# Patient Record
Sex: Female | Born: 1980 | Race: Black or African American | Hispanic: No | Marital: Single | Smoking: Current every day smoker
Health system: Southern US, Community
[De-identification: ages and names within clinical notes are randomized; demographics above are authoritative.]

## PROBLEM LIST (undated history)

## (undated) DIAGNOSIS — Z124 Encounter for screening for malignant neoplasm of cervix: Secondary | ICD-10-CM

## (undated) DIAGNOSIS — Z720 Tobacco use: Secondary | ICD-10-CM

## (undated) DIAGNOSIS — D649 Anemia, unspecified: Secondary | ICD-10-CM

## (undated) DIAGNOSIS — E669 Obesity, unspecified: Secondary | ICD-10-CM

## (undated) DIAGNOSIS — M67439 Ganglion, unspecified wrist: Secondary | ICD-10-CM

## (undated) DIAGNOSIS — J209 Acute bronchitis, unspecified: Secondary | ICD-10-CM

## (undated) DIAGNOSIS — F32A Depression, unspecified: Secondary | ICD-10-CM

## (undated) DIAGNOSIS — F418 Other specified anxiety disorders: Secondary | ICD-10-CM

## (undated) DIAGNOSIS — F329 Major depressive disorder, single episode, unspecified: Secondary | ICD-10-CM

## (undated) DIAGNOSIS — R569 Unspecified convulsions: Secondary | ICD-10-CM

## (undated) DIAGNOSIS — Z0000 Encounter for general adult medical examination without abnormal findings: Secondary | ICD-10-CM

## (undated) DIAGNOSIS — B019 Varicella without complication: Secondary | ICD-10-CM

## (undated) HISTORY — DX: Anemia, unspecified: D64.9

## (undated) HISTORY — DX: Unspecified convulsions: R56.9

## (undated) HISTORY — DX: Major depressive disorder, single episode, unspecified: F32.9

## (undated) HISTORY — DX: Other specified anxiety disorders: F41.8

## (undated) HISTORY — DX: Tobacco use: Z72.0

## (undated) HISTORY — DX: Encounter for general adult medical examination without abnormal findings: Z00.00

## (undated) HISTORY — DX: Ganglion, unspecified wrist: M67.439

## (undated) HISTORY — DX: Encounter for screening for malignant neoplasm of cervix: Z12.4

## (undated) HISTORY — DX: Depression, unspecified: F32.A

## (undated) HISTORY — DX: Obesity, unspecified: E66.9

## (undated) HISTORY — DX: Varicella without complication: B01.9

## (undated) HISTORY — DX: Acute bronchitis, unspecified: J20.9

---

## 2012-07-06 ENCOUNTER — Ambulatory Visit: Payer: Self-pay | Admitting: Family Medicine

## 2012-08-04 ENCOUNTER — Ambulatory Visit (INDEPENDENT_AMBULATORY_CARE_PROVIDER_SITE_OTHER): Payer: BC Managed Care – PPO | Admitting: Family Medicine

## 2012-08-04 ENCOUNTER — Encounter: Payer: Self-pay | Admitting: Family Medicine

## 2012-08-04 VITALS — BP 108/82 | HR 82 | Temp 98.5°F | Ht 67.5 in | Wt 248.0 lb

## 2012-08-04 DIAGNOSIS — R569 Unspecified convulsions: Secondary | ICD-10-CM | POA: Insufficient documentation

## 2012-08-04 DIAGNOSIS — F17200 Nicotine dependence, unspecified, uncomplicated: Secondary | ICD-10-CM

## 2012-08-04 DIAGNOSIS — M67439 Ganglion, unspecified wrist: Secondary | ICD-10-CM | POA: Insufficient documentation

## 2012-08-04 DIAGNOSIS — F329 Major depressive disorder, single episode, unspecified: Secondary | ICD-10-CM

## 2012-08-04 DIAGNOSIS — M6740 Ganglion, unspecified site: Secondary | ICD-10-CM

## 2012-08-04 DIAGNOSIS — Z0000 Encounter for general adult medical examination without abnormal findings: Secondary | ICD-10-CM

## 2012-08-04 DIAGNOSIS — M67432 Ganglion, left wrist: Secondary | ICD-10-CM

## 2012-08-04 DIAGNOSIS — F418 Other specified anxiety disorders: Secondary | ICD-10-CM

## 2012-08-04 DIAGNOSIS — E669 Obesity, unspecified: Secondary | ICD-10-CM | POA: Insufficient documentation

## 2012-08-04 DIAGNOSIS — Z720 Tobacco use: Secondary | ICD-10-CM

## 2012-08-04 DIAGNOSIS — Z7251 High risk heterosexual behavior: Secondary | ICD-10-CM

## 2012-08-04 DIAGNOSIS — F341 Dysthymic disorder: Secondary | ICD-10-CM

## 2012-08-04 HISTORY — DX: Ganglion, unspecified wrist: M67.439

## 2012-08-04 HISTORY — DX: Tobacco use: Z72.0

## 2012-08-04 HISTORY — DX: Encounter for general adult medical examination without abnormal findings: Z00.00

## 2012-08-04 HISTORY — DX: Other specified anxiety disorders: F41.8

## 2012-08-04 LAB — LIPID PANEL
Cholesterol: 178 mg/dL (ref 0–200)
LDL Cholesterol: 116 mg/dL — ABNORMAL HIGH (ref 0–99)
Total CHOL/HDL Ratio: 4.3 Ratio
VLDL: 21 mg/dL (ref 0–40)

## 2012-08-04 LAB — HEPATIC FUNCTION PANEL
Bilirubin, Direct: 0.1 mg/dL (ref 0.0–0.3)
Indirect Bilirubin: 0.2 mg/dL (ref 0.0–0.9)
Total Bilirubin: 0.3 mg/dL (ref 0.3–1.2)

## 2012-08-04 LAB — CBC
HCT: 40.4 % (ref 36.0–46.0)
Hemoglobin: 13.7 g/dL (ref 12.0–15.0)
MCH: 32.2 pg (ref 26.0–34.0)
MCHC: 33.9 g/dL (ref 30.0–36.0)
MCV: 94.8 fL (ref 78.0–100.0)
RDW: 14.1 % (ref 11.5–15.5)

## 2012-08-04 LAB — RENAL FUNCTION PANEL
BUN: 15 mg/dL (ref 6–23)
Chloride: 104 mEq/L (ref 96–112)
Creat: 0.88 mg/dL (ref 0.50–1.10)
Glucose, Bld: 83 mg/dL (ref 70–99)
Phosphorus: 3 mg/dL (ref 2.3–4.6)

## 2012-08-04 LAB — RPR

## 2012-08-04 LAB — HIV ANTIBODY (ROUTINE TESTING W REFLEX): HIV: NONREACTIVE

## 2012-08-04 MED ORDER — ALPRAZOLAM 0.25 MG PO TABS: 0.2500 mg | ORAL_TABLET | Freq: Two times a day (BID) | ORAL | Status: DC | PRN

## 2012-08-04 MED ORDER — ESCITALOPRAM OXALATE 10 MG PO TABS: 10.0000 mg | ORAL_TABLET | Freq: Every day | ORAL | Status: DC

## 2012-08-04 NOTE — Assessment & Plan Note (Signed)
Started on Ecitalopram daily and may use Alprazolam sparingly, reassess at next visit

## 2012-08-04 NOTE — Assessment & Plan Note (Signed)
Encouraged DASH diet and increased exercise, check labs.

## 2012-08-04 NOTE — Patient Instructions (Addendum)
Next visit gyn   Preventive Care for Adults, Female A healthy lifestyle and preventive care can promote health and wellness. Preventive health guidelines for women include the following key practices.  A routine yearly physical is a good way to check with your caregiver about your health and preventive screening. It is a chance to share any concerns and updates on your health, and to receive a thorough exam.  Visit your dentist for a routine exam and preventive care every 6 months. Brush your teeth twice a day and floss once a day. Good oral hygiene prevents tooth decay and gum disease.  The frequency of eye exams is based on your age, health, family medical history, use of contact lenses, and other factors. Follow your caregiver's recommendations for frequency of eye exams.  Eat a healthy diet. Foods like vegetables, fruits, whole grains, low-fat dairy products, and lean protein foods contain the nutrients you need without too many calories. Decrease your intake of foods high in solid fats, added sugars, and salt. Eat the right amount of calories for you.Get information about a proper diet from your caregiver, if necessary.  Regular physical exercise is one of the most important things you can do for your health. Most adults should get at least 150 minutes of moderate-intensity exercise (any activity that increases your heart rate and causes you to sweat) each week. In addition, most adults need muscle-strengthening exercises on 2 or more days a week.  Maintain a healthy weight. The body mass index (BMI) is a screening tool to identify possible weight problems. It provides an estimate of body fat based on height and weight. Your caregiver can help determine your BMI, and can help you achieve or maintain a healthy weight.For adults 20 years and older:  A BMI below 18.5 is considered underweight.  A BMI of 18.5 to 24.9 is normal.  A BMI of 25 to 29.9 is considered overweight.  A BMI of 30  and above is considered obese.  Maintain normal blood lipids and cholesterol levels by exercising and minimizing your intake of saturated fat. Eat a balanced diet with plenty of fruit and vegetables. Blood tests for lipids and cholesterol should begin at age 33 and be repeated every 5 years. If your lipid or cholesterol levels are high, you are over 50, or you are at high risk for heart disease, you may need your cholesterol levels checked more frequently.Ongoing high lipid and cholesterol levels should be treated with medicines if diet and exercise are not effective.  If you smoke, find out from your caregiver how to quit. If you do not use tobacco, do not start.  If you are pregnant, do not drink alcohol. If you are breastfeeding, be very cautious about drinking alcohol. If you are not pregnant and choose to drink alcohol, do not exceed 1 drink per day. One drink is considered to be 12 ounces (355 mL) of beer, 5 ounces (148 mL) of wine, or 1.5 ounces (44 mL) of liquor.  Avoid use of street drugs. Do not share needles with anyone. Ask for help if you need support or instructions about stopping the use of drugs.  High blood pressure causes heart disease and increases the risk of stroke. Your blood pressure should be checked at least every 1 to 2 years. Ongoing high blood pressure should be treated with medicines if weight loss and exercise are not effective.  If you are 33 to 32 years old, ask your caregiver if you should take aspirin  to prevent strokes.  Diabetes screening involves taking a blood sample to check your fasting blood sugar level. This should be done once every 3 years, after age 14, if you are within normal weight and without risk factors for diabetes. Testing should be considered at a younger age or be carried out more frequently if you are overweight and have at least 1 risk factor for diabetes.  Breast cancer screening is essential preventive care for women. You should practice  "breast self-awareness." This means understanding the normal appearance and feel of your breasts and may include breast self-examination. Any changes detected, no matter how small, should be reported to a caregiver. Women in their 84s and 30s should have a clinical breast exam (CBE) by a caregiver as part of a regular health exam every 1 to 3 years. After age 19, women should have a CBE every year. Starting at age 20, women should consider having a mammography (breast X-ray test) every year. Women who have a family history of breast cancer should talk to their caregiver about genetic screening. Women at a high risk of breast cancer should talk to their caregivers about having magnetic resonance imaging (MRI) and a mammography every year.  The Pap test is a screening test for cervical cancer. A Pap test can show cell changes on the cervix that might become cervical cancer if left untreated. A Pap test is a procedure in which cells are obtained and examined from the lower end of the uterus (cervix).  Women should have a Pap test starting at age 21.  Between ages 35 and 48, Pap tests should be repeated every 2 years.  Beginning at age 26, you should have a Pap test every 3 years as long as the past 3 Pap tests have been normal.  Some women have medical problems that increase the chance of getting cervical cancer. Talk to your caregiver about these problems. It is especially important to talk to your caregiver if a new problem develops soon after your last Pap test. In these cases, your caregiver may recommend more frequent screening and Pap tests.  The above recommendations are the same for women who have or have not gotten the vaccine for human papillomavirus (HPV).  If you had a hysterectomy for a problem that was not cancer or a condition that could lead to cancer, then you no longer need Pap tests. Even if you no longer need a Pap test, a regular exam is a good idea to make sure no other problems are  starting.  If you are between ages 63 and 64, and you have had normal Pap tests going back 10 years, you no longer need Pap tests. Even if you no longer need a Pap test, a regular exam is a good idea to make sure no other problems are starting.  If you have had past treatment for cervical cancer or a condition that could lead to cancer, you need Pap tests and screening for cancer for at least 20 years after your treatment.  If Pap tests have been discontinued, risk factors (such as a new sexual partner) need to be reassessed to determine if screening should be resumed.  The HPV test is an additional test that may be used for cervical cancer screening. The HPV test looks for the virus that can cause the cell changes on the cervix. The cells collected during the Pap test can be tested for HPV. The HPV test could be used to screen women aged 20  years and older, and should be used in women of any age who have unclear Pap test results. After the age of 59, women should have HPV testing at the same frequency as a Pap test.  Colorectal cancer can be detected and often prevented. Most routine colorectal cancer screening begins at the age of 23 and continues through age 88. However, your caregiver may recommend screening at an earlier age if you have risk factors for colon cancer. On a yearly basis, your caregiver may provide home test kits to check for hidden blood in the stool. Use of a small camera at the end of a tube, to directly examine the colon (sigmoidoscopy or colonoscopy), can detect the earliest forms of colorectal cancer. Talk to your caregiver about this at age 49, when routine screening begins. Direct examination of the colon should be repeated every 5 to 10 years through age 39, unless early forms of pre-cancerous polyps or small growths are found.  Hepatitis C blood testing is recommended for all people born from [birth year] through 1965 and any individual with known risks for hepatitis C.  Practice  safe sex. Use condoms and avoid high-risk sexual practices to reduce the spread of sexually transmitted infections (STIs). STIs include gonorrhea, chlamydia, syphilis, trichomonas, herpes, HPV, and human immunodeficiency virus (HIV). Herpes, HIV, and HPV are viral illnesses that have no cure. They can result in disability, cancer, and death. Sexually active women aged 39 and younger should be checked for chlamydia. Older women with new or multiple partners should also be tested for chlamydia. Testing for other STIs is recommended if you are sexually active and at increased risk.  Osteoporosis is a disease in which the bones lose minerals and strength with aging. This can result in serious bone fractures. The risk of osteoporosis can be identified using a bone density scan. Women ages 26 and over and women at risk for fractures or osteoporosis should discuss screening with their caregivers. Ask your caregiver whether you should take a calcium supplement or vitamin D to reduce the rate of osteoporosis.  Menopause can be associated with physical symptoms and risks. Hormone replacement therapy is available to decrease symptoms and risks. You should talk to your caregiver about whether hormone replacement therapy is right for you.  Use sunscreen with sun protection factor (SPF) of 30 or more. Apply sunscreen liberally and repeatedly throughout the day. You should seek shade when your shadow is shorter than you. Protect yourself by wearing long sleeves, pants, a wide-brimmed hat, and sunglasses year round, whenever you are outdoors.  Once a month, do a whole body skin exam, using a mirror to look at the skin on your back. Notify your caregiver of new moles, moles that have irregular borders, moles that are larger than a pencil eraser, or moles that have changed in shape or color.  Stay current with required immunizations.  Influenza. You need a dose every fall (or winter). The composition of the flu vaccine  changes each year, so being vaccinated once is not enough.  Pneumococcal polysaccharide. You need 1 to 2 doses if you smoke cigarettes or if you have certain chronic medical conditions. You need 1 dose at age 67 (or older) if you have never been vaccinated.  Tetanus, diphtheria, pertussis (Tdap, Td). Get 1 dose of Tdap vaccine if you are younger than age 52, are over 86 and have contact with an infant, are a Research scientist (physical sciences), are pregnant, or simply want to be protected from whooping cough. After  that, you need a Td booster dose every 10 years. Consult your caregiver if you have not had at least 3 tetanus and diphtheria-containing shots sometime in your life or have a deep or dirty wound.  HPV. You need this vaccine if you are a woman age 77 or younger. The vaccine is given in 3 doses over 6 months.  Measles, mumps, rubella (MMR). You need at least 1 dose of MMR if you were born in 1957 or later. You may also need a second dose.  Meningococcal. If you are age 48 to 12 and a first-year college student living in a residence hall, or have one of several medical conditions, you need to get vaccinated against meningococcal disease. You may also need additional booster doses.  Zoster (shingles). If you are age 26 or older, you should get this vaccine.  Varicella (chickenpox). If you have never had chickenpox or you were vaccinated but received only 1 dose, talk to your caregiver to find out if you need this vaccine.  Hepatitis A. You need this vaccine if you have a specific risk factor for hepatitis A virus infection or you simply wish to be protected from this disease. The vaccine is usually given as 2 doses, 6 to 18 months apart.  Hepatitis B. You need this vaccine if you have a specific risk factor for hepatitis B virus infection or you simply wish to be protected from this disease. The vaccine is given in 3 doses, usually over 6 months. Preventive Services / Frequency Ages 32 to 102  Blood  pressure check.** / Every 1 to 2 years.  Lipid and cholesterol check.** / Every 5 years beginning at age 64.  Clinical breast exam.** / Every 3 years for women in their 22s and 30s.  Pap test.** / Every 2 years from ages 21 through 76. Every 3 years starting at age 8 through age 35 or 41 with a history of 3 consecutive normal Pap tests.  HPV screening.** / Every 3 years from ages 34 through ages 84 to 10 with a history of 3 consecutive normal Pap tests.  Hepatitis C blood test.** / For any individual with known risks for hepatitis C.  Skin self-exam. / Monthly.  Influenza immunization.** / Every year.  Pneumococcal polysaccharide immunization.** / 1 to 2 doses if you smoke cigarettes or if you have certain chronic medical conditions.  Tetanus, diphtheria, pertussis (Tdap, Td) immunization. / A one-time dose of Tdap vaccine. After that, you need a Td booster dose every 10 years.  HPV immunization. / 3 doses over 6 months, if you are 73 and younger.  Measles, mumps, rubella (MMR) immunization. / You need at least 1 dose of MMR if you were born in 1957 or later. You may also need a second dose.  Meningococcal immunization. / 1 dose if you are age 74 to 26 and a first-year college student living in a residence hall, or have one of several medical conditions, you need to get vaccinated against meningococcal disease. You may also need additional booster doses.  Varicella immunization.** / Consult your caregiver.  Hepatitis A immunization.** / Consult your caregiver. 2 doses, 6 to 18 months apart.  Hepatitis B immunization.** / Consult your caregiver. 3 doses usually over 6 months. Ages 79 to 10  Blood pressure check.** / Every 1 to 2 years.  Lipid and cholesterol check.** / Every 5 years beginning at age 49.  Clinical breast exam.** / Every year after age 94.  Mammogram.** / Every  year beginning at age 87 and continuing for as long as you are in good health. Consult with your  caregiver.  Pap test.** / Every 3 years starting at age 71 through age 81 or 52 with a history of 3 consecutive normal Pap tests.  HPV screening.** / Every 3 years from ages 25 through ages 29 to 53 with a history of 3 consecutive normal Pap tests.  Fecal occult blood test (FOBT) of stool. / Every year beginning at age 4 and continuing until age 74. You may not need to do this test if you get a colonoscopy every 10 years.  Flexible sigmoidoscopy or colonoscopy.** / Every 5 years for a flexible sigmoidoscopy or every 10 years for a colonoscopy beginning at age 2 and continuing until age 74.  Hepatitis C blood test.** / For all people born from [birth year] through 1965 and any individual with known risks for hepatitis C.  Skin self-exam. / Monthly.  Influenza immunization.** / Every year.  Pneumococcal polysaccharide immunization.** / 1 to 2 doses if you smoke cigarettes or if you have certain chronic medical conditions.  Tetanus, diphtheria, pertussis (Tdap, Td) immunization.** / A one-time dose of Tdap vaccine. After that, you need a Td booster dose every 10 years.  Measles, mumps, rubella (MMR) immunization. / You need at least 1 dose of MMR if you were born in 1957 or later. You may also need a second dose.  Varicella immunization.** / Consult your caregiver.  Meningococcal immunization.** / Consult your caregiver.  Hepatitis A immunization.** / Consult your caregiver. 2 doses, 6 to 18 months apart.  Hepatitis B immunization.** / Consult your caregiver. 3 doses, usually over 6 months. Ages 63 and over  Blood pressure check.** / Every 1 to 2 years.  Lipid and cholesterol check.** / Every 5 years beginning at age 41.  Clinical breast exam.** / Every year after age 61.  Mammogram.** / Every year beginning at age 34 and continuing for as long as you are in good health. Consult with your caregiver.  Pap test.** / Every 3 years starting at age 65 through age 19 or 105 with a 3  consecutive normal Pap tests. Testing can be stopped between 65 and 70 with 3 consecutive normal Pap tests and no abnormal Pap or HPV tests in the past 10 years.  HPV screening.** / Every 3 years from ages 37 through ages 46 or 72 with a history of 3 consecutive normal Pap tests. Testing can be stopped between 65 and 70 with 3 consecutive normal Pap tests and no abnormal Pap or HPV tests in the past 10 years.  Fecal occult blood test (FOBT) of stool. / Every year beginning at age 50 and continuing until age 83. You may not need to do this test if you get a colonoscopy every 10 years.  Flexible sigmoidoscopy or colonoscopy.** / Every 5 years for a flexible sigmoidoscopy or every 10 years for a colonoscopy beginning at age 57 and continuing until age 18.  Hepatitis C blood test.** / For all people born from [birth year] through 1965 and any individual with known risks for hepatitis C.  Osteoporosis screening.** / A one-time screening for women ages 105 and over and women at risk for fractures or osteoporosis.  Skin self-exam. / Monthly.  Influenza immunization.** / Every year.  Pneumococcal polysaccharide immunization.** / 1 dose at age 102 (or older) if you have never been vaccinated.  Tetanus, diphtheria, pertussis (Tdap, Td) immunization. / A one-time dose of Tdap  vaccine if you are over 65 and have contact with an infant, are a Research scientist (physical sciences), or simply want to be protected from whooping cough. After that, you need a Td booster dose every 10 years.  Varicella immunization.** / Consult your caregiver.  Meningococcal immunization.** / Consult your caregiver.  Hepatitis A immunization.** / Consult your caregiver. 2 doses, 6 to 18 months apart.  Hepatitis B immunization.** / Check with your caregiver. 3 doses, usually over 6 months. ** Family history and personal history of risk and conditions may change your caregiver's recommendations. Document Released: 04/01/2001 Document Revised: 04/28/2011  Document Reviewed: 07/01/2010 [facility] Patient Information 2014 [place], [place].

## 2012-08-04 NOTE — Assessment & Plan Note (Signed)
Will return for pap at next visit. Labs drawn today. enocouraged DASH diet,

## 2012-08-04 NOTE — Progress Notes (Signed)
Patient ID: Toni Weiss, female   DOB: [birth year], 32 y.o.   MRN: [id number] Wandalee Klang [id number] Feb 22, 1980 08/04/2012      Progress Note New Patient  Subjective  Chief Complaint  Chief Complaint  Patient presents with  . Establish Care    new patient    HPI  Patient is a 32 year old [place] American female who is in today for new patient appointment. She's not had health insurance for many years so has not seen a doctor. He is struggling with significant depression and anxiety. Sheet knowledge is low mood frequently and recently has been having increased anxiety and panic attacks. She's had several episodes of palpitations with shortness or breath a sense of doom tremulousness and outbursts. She is here today in hopes of starting medications to manage her condition. 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 She offers a complaint of a mildly tender lesion on her left wrist for the last few months. Beer seizures are noted into her fingers result. No chest pain at this time. No GI or GU concerns noted at today's visit.  Past Medical History  Diagnosis Date  . Chicken pox as a child  . Seizures     ended by age 28  . Depression   . Obesity   . Anemia     in pregnancy  . Depression with anxiety 08/04/2012  . Tobacco abuse 08/04/2012  . Ganglion cyst of wrist 08/04/2012    left  . Preventative health care 08/04/2012    History reviewed. No pertinent past surgical history.  Family History  Problem Relation Age of Onset  . Hypertension Mother   . Other Mother 24    pacemaker  . Depression Mother   . Thyroid disease Mother     hypo  . Heart disease Mother 34    bradycardia to low of 32, now with pacer, valvular heart dz  . Hyperlipidemia Father   . Hypertension Father   . Other Father     blood clots in lung  . Depression Father   . Depression Brother   . Other Brother     slow leaky valve  . Heart disease Brother    valvular heart disease  . Asthma Son   . Alcohol abuse Maternal Grandmother   . Heart disease Maternal Grandmother   . Stroke Maternal Grandmother   . Thyroid disease Maternal Grandmother     hypo  . Cancer Maternal Grandfather     prostate  . Alzheimer's disease Paternal Grandmother   . Cancer Paternal Grandfather     prostate  . Heart attack Paternal Grandfather   . Heart disease Paternal Grandfather 30    MI  . Thyroid disease Maternal Aunt     hyper  . Heart disease Maternal Aunt     History   Social History  . Marital Status: Single    Spouse Name: N/A    Number of Children: N/A  . Years of Education: N/A   Occupational History  . Not on file.   Social History Main Topics  . Smoking status: Current Every Day Smoker -- 0.25 packs/day for 10 years    Types: Cigarettes  . Smokeless tobacco: Never Used  . Alcohol Use: Yes     Comment: occasionally  . Drug Use: No  . Sexually Active: Yes -- Female partner(s)   Other Topics Concern  . Not on file   Social History Narrative  . No narrative  on file    No current outpatient prescriptions on file prior to visit.   No current facility-administered medications on file prior to visit.    Allergies  Allergen Reactions  . Latex     Review of Systems  Review of Systems  Constitutional: Negative for fever, chills and malaise/fatigue.  HENT: Negative for hearing loss, nosebleeds and congestion.   Eyes: Negative for pain and discharge.  Respiratory: Negative for cough, sputum production, shortness of breath and wheezing.   Cardiovascular: Negative for chest pain, palpitations and leg swelling.  Gastrointestinal: Negative for heartburn, nausea, vomiting, abdominal pain, diarrhea, constipation and blood in stool.  Genitourinary: Negative for dysuria, urgency, frequency and hematuria.  Musculoskeletal: Positive for joint pain. Negative for myalgias, back pain and falls.  Skin: Negative for rash.  Neurological:  Negative for dizziness, tremors, sensory change, focal weakness, loss of consciousness, weakness and headaches.  Endo/Heme/Allergies: Negative for polydipsia. Does not bruise/bleed easily.  Psychiatric/Behavioral: Positive for depression. Negative for suicidal ideas. The patient is nervous/anxious. The patient does not have insomnia.     Objective  BP 108/82  Pulse 82  Temp(Src) 98.5 F (36.9 C) (Oral)  Ht 5' 7.5" (1.715 m)  Wt 248 lb 0.6 oz (112.51 kg)  BMI 38.25 kg/m2  SpO2 98%  LMP 07/21/2012  Physical Exam  Physical Exam  Constitutional: She is oriented to person, place, and time and well-developed, well-nourished, and in no distress. No distress.  HENT:  Head: Normocephalic and atraumatic.  Right Ear: External ear normal.  Left Ear: External ear normal.  Nose: Nose normal.  Mouth/Throat: Oropharynx is clear and moist. No oropharyngeal exudate.  Eyes: Conjunctivae are normal. Pupils are equal, round, and reactive to light. Right eye exhibits no discharge. Left eye exhibits no discharge. No scleral icterus.  Neck: Normal range of motion. Neck supple. No thyromegaly present.  Cardiovascular: Normal rate, regular rhythm, normal heart sounds and intact distal pulses.   No murmur heard. Pulmonary/Chest: Effort normal and breath sounds normal. No respiratory distress. She has no wheezes. She has no rales.  Abdominal: Soft. Bowel sounds are normal. She exhibits no distension and no mass. There is no tenderness.  Musculoskeletal: Normal range of motion. She exhibits no edema and no tenderness.  frm nontender, slightly mobile lesion on left posterior wrist  Lymphadenopathy:    She has no cervical adenopathy.  Neurological: She is alert and oriented to person, place, and time. She has normal reflexes. No cranial nerve deficit. Coordination normal.  Skin: Skin is warm and dry. No rash noted. She is not diaphoretic.  Psychiatric: Mood, memory and affect normal.       Assessment &  Plan  Depression with anxiety Started on Ecitalopram daily and may use Alprazolam sparingly, reassess at next visit  Tobacco abuse Encouraged complete cessation can consider nicotine replacement products.  Ganglion cyst of wrist Encouraged ice and Aspercreme, splinting prn and consider referral if continues to enlarge.  Obesity Encouraged DASH diet and increased exercise, check labs.  Preventative health care Will return for pap at next visit. Labs drawn today. enocouraged DASH diet,

## 2012-08-04 NOTE — Assessment & Plan Note (Signed)
Encouraged ice and Aspercreme, splinting prn and consider referral if continues to enlarge.

## 2012-08-04 NOTE — Assessment & Plan Note (Signed)
Encouraged complete cessation can consider nicotine replacement products.

## 2012-09-13 ENCOUNTER — Ambulatory Visit: Payer: BC Managed Care – PPO | Admitting: Family Medicine

## 2012-10-02 ENCOUNTER — Other Ambulatory Visit: Payer: Self-pay | Admitting: Family Medicine

## 2012-11-03 ENCOUNTER — Encounter: Payer: Self-pay | Admitting: Family Medicine

## 2012-11-03 ENCOUNTER — Ambulatory Visit (INDEPENDENT_AMBULATORY_CARE_PROVIDER_SITE_OTHER): Payer: BC Managed Care – PPO | Admitting: Family Medicine

## 2012-11-03 ENCOUNTER — Other Ambulatory Visit: Payer: Self-pay | Admitting: Family Medicine

## 2012-11-03 ENCOUNTER — Encounter: Payer: Self-pay | Admitting: *Deleted

## 2012-11-03 VITALS — BP 100/72 | HR 64 | Temp 98.4°F | Ht 67.5 in | Wt 254.1 lb

## 2012-11-03 DIAGNOSIS — Z0000 Encounter for general adult medical examination without abnormal findings: Secondary | ICD-10-CM

## 2012-11-03 DIAGNOSIS — G43909 Migraine, unspecified, not intractable, without status migrainosus: Secondary | ICD-10-CM

## 2012-11-03 DIAGNOSIS — N926 Irregular menstruation, unspecified: Secondary | ICD-10-CM

## 2012-11-03 DIAGNOSIS — Z720 Tobacco use: Secondary | ICD-10-CM

## 2012-11-03 DIAGNOSIS — F418 Other specified anxiety disorders: Secondary | ICD-10-CM

## 2012-11-03 DIAGNOSIS — E669 Obesity, unspecified: Secondary | ICD-10-CM

## 2012-11-03 DIAGNOSIS — F341 Dysthymic disorder: Secondary | ICD-10-CM

## 2012-11-03 DIAGNOSIS — J209 Acute bronchitis, unspecified: Secondary | ICD-10-CM

## 2012-11-03 DIAGNOSIS — F329 Major depressive disorder, single episode, unspecified: Secondary | ICD-10-CM

## 2012-11-03 DIAGNOSIS — F17200 Nicotine dependence, unspecified, uncomplicated: Secondary | ICD-10-CM

## 2012-11-03 DIAGNOSIS — Z124 Encounter for screening for malignant neoplasm of cervix: Secondary | ICD-10-CM

## 2012-11-03 HISTORY — DX: Encounter for screening for malignant neoplasm of cervix: Z12.4

## 2012-11-03 MED ORDER — TOPIRAMATE 25 MG PO TABS: 12.5000 mg | ORAL_TABLET | Freq: Two times a day (BID) | ORAL | Status: DC

## 2012-11-03 MED ORDER — AZITHROMYCIN 250 MG PO TABS: ORAL_TABLET | ORAL | Status: DC

## 2012-11-03 MED ORDER — PHENTERMINE HCL 15 MG PO CAPS
15.0000 mg | ORAL_CAPSULE | ORAL | Status: AC
Start: 2012-11-03 — End: ?

## 2012-11-03 MED ORDER — ALPRAZOLAM 0.25 MG PO TABS: 0.2500 mg | ORAL_TABLET | Freq: Two times a day (BID) | ORAL | Status: DC | PRN

## 2012-11-03 MED ORDER — ESCITALOPRAM OXALATE 10 MG PO TABS: 10.0000 mg | ORAL_TABLET | Freq: Every day | ORAL | Status: DC

## 2012-11-03 NOTE — Patient Instructions (Addendum)
DASH Diet  The DASH diet stands for "Dietary Approaches to Stop Hypertension." It is a healthy eating plan that has been shown to reduce high blood pressure (hypertension) in as little as 14 days, while also possibly providing other significant health benefits. These other health benefits include reducing the risk of breast cancer after menopause and reducing the risk of type 2 diabetes, heart disease, colon cancer, and stroke. Health benefits also include weight loss and slowing kidney failure in patients with chronic kidney disease.   DIET GUIDELINES  · Limit salt (sodium). Your diet should contain less than 1500 mg of sodium daily.  · Limit refined or processed carbohydrates. Your diet should include mostly whole grains. Desserts and added sugars should be used sparingly.  · Include small amounts of heart-healthy fats. These types of fats include nuts, oils, and tub margarine. Limit saturated and trans fats. These fats have been shown to be harmful in the body.  CHOOSING FOODS   The following food groups are based on a 2000 calorie diet. See your Registered Dietitian for individual calorie needs.  Grains and Grain Products (6 to 8 servings daily)  · Eat More Often: Whole-wheat bread, brown rice, whole-grain or wheat pasta, quinoa, popcorn without added fat or salt (air popped).  · Eat Less Often: White bread, white pasta, white rice, cornbread.  Vegetables (4 to 5 servings daily)  · Eat More Often: Fresh, frozen, and canned vegetables. Vegetables may be raw, steamed, roasted, or grilled with a minimal amount of fat.  · Eat Less Often/Avoid: Creamed or fried vegetables. Vegetables in a cheese sauce.  Fruit (4 to 5 servings daily)  · Eat More Often: All fresh, canned (in natural juice), or frozen fruits. Dried fruits without added sugar. One hundred percent fruit juice (½ cup [237 mL] daily).  · Eat Less Often: Dried fruits with added sugar. Canned fruit in light or heavy syrup.  Lean Meats, Fish, and Poultry (2  servings or less daily. One serving is 3 to 4 oz [85-114 g]).  · Eat More Often: Ninety percent or leaner ground beef, tenderloin, sirloin. Round cuts of beef, chicken breast, turkey breast. All fish. Grill, bake, or broil your meat. Nothing should be fried.  · Eat Less Often/Avoid: Fatty cuts of meat, turkey, or chicken leg, thigh, or wing. Fried cuts of meat or fish.  Dairy (2 to 3 servings)  · Eat More Often: Low-fat or fat-free milk, low-fat plain or light yogurt, reduced-fat or part-skim cheese.  · Eat Less Often/Avoid: Milk (whole, 2%). Whole milk yogurt. Full-fat cheeses.  Nuts, Seeds, and Legumes (4 to 5 servings per week)  · Eat More Often: All without added salt.  · Eat Less Often/Avoid: Salted nuts and seeds, canned beans with added salt.  Fats and Sweets (limited)  · Eat More Often: Vegetable oils, tub margarines without trans fats, sugar-free gelatin. Mayonnaise and salad dressings.  · Eat Less Often/Avoid: Coconut oils, palm oils, butter, stick margarine, cream, half and half, cookies, candy, pie.  FOR MORE INFORMATION  The Dash Diet Eating Plan: www.dashdiet.org  Document Released: 01/23/2011 Document Revised: 04/28/2011 Document Reviewed: 01/23/2011  Exit[employer]® Patient Information ©2014 [employer].

## 2012-11-05 ENCOUNTER — Ambulatory Visit (HOSPITAL_BASED_OUTPATIENT_CLINIC_OR_DEPARTMENT_OTHER): Payer: BC Managed Care – PPO

## 2012-11-06 ENCOUNTER — Other Ambulatory Visit: Payer: Self-pay | Admitting: Family Medicine

## 2012-11-06 ENCOUNTER — Encounter: Payer: Self-pay | Admitting: Family Medicine

## 2012-11-06 DIAGNOSIS — Z124 Encounter for screening for malignant neoplasm of cervix: Secondary | ICD-10-CM

## 2012-11-06 DIAGNOSIS — J209 Acute bronchitis, unspecified: Secondary | ICD-10-CM

## 2012-11-06 HISTORY — DX: Acute bronchitis, unspecified: J20.9

## 2012-11-06 NOTE — Assessment & Plan Note (Addendum)
Encouraged DASH diet and increased exercise. Will try Phentermine 15 mg daily

## 2012-11-06 NOTE — Assessment & Plan Note (Signed)
Encouraged adequate sleep, regular exercise, DASH diet, reviewed annual labs, pap today

## 2012-11-06 NOTE — Assessment & Plan Note (Signed)
Pap taken at visit but unfortunately felt to have scant cellularity so will need repeat pap, referred to gyn for second pap

## 2012-11-06 NOTE — Progress Notes (Signed)
Patient ID: Toni Weiss, female   DOB: [birth year], 31 y.o.   MRN: [id number] Toni Weiss [id number] [birth year] 11/06/2012      Progress Note New Patient  Subjective  Chief Complaint  Chief Complaint  Patient presents with  . Gynecologic Exam    pap and breast exam    HPI  Patient is a 32 year old African American female who is in today for routine medical care. She has been struggling with a cough for about the last 3 weeks. Is productive of green phlegm. She has some malaise but denies fevers chills. Has some pressure in her ears left greater than right. He quit smoking about 5 days ago. No chest pain or palpitations. No shortness of breath GI or GU complaints. Does feel she is advised to Lexapro and needs the alprazolam very infrequently.  Past Medical History  Diagnosis Date  . Chicken pox as a child  . Seizures     ended by age 40  . Depression   . Obesity   . Anemia     in pregnancy  . Depression with anxiety 08/04/2012  . Tobacco abuse 08/04/2012  . Ganglion cyst of wrist 08/04/2012    left  . Preventative health care 08/04/2012  . Cervical cancer screening 11/03/2012  . Acute bronchitis 11/06/2012    History reviewed. No pertinent past surgical history.  Family History  Problem Relation Age of Onset  . Hypertension Mother   . Other Mother 66    pacemaker  . Depression Mother   . Thyroid disease Mother     hypo  . Heart disease Mother 34    bradycardia to low of 32, now with pacer, valvular heart dz  . Hyperlipidemia Father   . Hypertension Father   . Other Father     blood clots in lung  . Depression Father   . Depression Brother   . Other Brother     slow leaky valve  . Heart disease Brother     valvular heart disease  . Asthma Son   . Alcohol abuse Maternal Grandmother   . Heart disease Maternal Grandmother   . Stroke Maternal Grandmother   . Thyroid disease Maternal Grandmother     hypo  . Cancer Maternal Grandfather     prostate  . Alzheimer's  disease Paternal Grandmother   . Cancer Paternal Grandfather     prostate  . Heart attack Paternal Grandfather   . Heart disease Paternal Grandfather 37    MI  . Thyroid disease Maternal Aunt     hyper  . Heart disease Maternal Aunt     History   Social History  . Marital Status: Single    Spouse Name: N/A    Number of Children: N/A  . Years of Education: N/A   Occupational History  . Not on file.   Social History Main Topics  . Smoking status: Current Every Day Smoker -- 0.25 packs/day for 10 years    Types: Cigarettes    Start date: 10/30/2012  . Smokeless tobacco: Never Used  . Alcohol Use: Yes     Comment: occasionally  . Drug Use: No  . Sexual Activity: Yes    Partners: Male   Other Topics Concern  . Not on file   Social History Narrative  . No narrative on file    Current Outpatient Prescriptions on File Prior to Visit  Medication Sig Dispense Refill  . Multiple Vitamins-Minerals (HM MULTIVITAMIN ADULT GUMMY PO) Take by mouth daily.  No current facility-administered medications on file prior to visit.    Allergies  Allergen Reactions  . Latex     Review of Systems  Review of Systems  Constitutional: Negative for fever, chills and malaise/fatigue.  HENT: Positive for ear pain and congestion. Negative for hearing loss and nosebleeds.   Eyes: Negative for discharge.  Respiratory: Positive for cough and sputum production. Negative for shortness of breath and wheezing.   Cardiovascular: Negative for chest pain, palpitations and leg swelling.  Gastrointestinal: Negative for heartburn, nausea, vomiting, abdominal pain, diarrhea, constipation and blood in stool.  Genitourinary: Negative for dysuria, urgency, frequency and hematuria.  Musculoskeletal: Negative for myalgias, back pain and falls.  Skin: Negative for rash.  Neurological: Negative for dizziness, tremors, sensory change, focal weakness, loss of consciousness, weakness and headaches.   Endo/Heme/Allergies: Negative for polydipsia. Does not bruise/bleed easily.  Psychiatric/Behavioral: Negative for depression and suicidal ideas. The patient is not nervous/anxious and does not have insomnia.     Objective  BP 100/72  Pulse 64  Temp(Src) 98.4 F (36.9 C) (Oral)  Ht 5' 7.5" (1.715 m)  Wt 254 lb 1.9 oz (115.268 kg)  BMI 39.19 kg/m2  SpO2 98%  LMP 09/13/2012  Physical Exam  Physical Exam  Constitutional: She is oriented to person, place, and time and well-developed, well-nourished, and in no distress. No distress.  HENT:  Head: Normocephalic and atraumatic.  Right Ear: External ear normal.  Left Ear: External ear normal.  Nose: Nose normal.  Mouth/Throat: Oropharynx is clear and moist. No oropharyngeal exudate.  Left TM midly erythematous.   Eyes: Conjunctivae are normal. Pupils are equal, round, and reactive to light. Right eye exhibits no discharge. Left eye exhibits no discharge. No scleral icterus.  Neck: Normal range of motion. Neck supple. No thyromegaly present.  Cardiovascular: Normal rate, regular rhythm, normal heart sounds and intact distal pulses.   No murmur heard. Pulmonary/Chest: Effort normal. No respiratory distress. She has no wheezes. She has rales.  Left lung base  Abdominal: Soft. Bowel sounds are normal. She exhibits no distension and no mass. There is no tenderness.  Genitourinary: Vagina normal, uterus normal, cervix normal, right adnexa normal and left adnexa normal. No vaginal discharge found.  Musculoskeletal: Normal range of motion. She exhibits no edema and no tenderness.  Lymphadenopathy:    She has no cervical adenopathy.  Neurological: She is alert and oriented to person, place, and time. She has normal reflexes. No cranial nerve deficit. Coordination normal.  Skin: Skin is warm and dry. No rash noted. She is not diaphoretic.  Psychiatric: Mood, memory and affect normal.       Assessment & Plan  Cervical cancer  screening Pap taken at visit but unfortunately felt to have scant cellularity so will need repeat pap, referred to gyn for second pap  Tobacco abuse No cigarettes this week encouraged continued avoidance  Obesity Encouraged DASH diet and increased exercise. Will try Phentermine 15 mg daily  Preventative health care Encouraged adequate sleep, regular exercise, DASH diet, reviewed annual labs, pap today  Acute bronchitis Given rx for Azithromycin, start a probiotic, increase rest and fluids.

## 2012-11-06 NOTE — Assessment & Plan Note (Signed)
No cigarettes this week encouraged continued avoidance

## 2012-11-06 NOTE — Assessment & Plan Note (Signed)
Given rx for Azithromycin, start a probiotic, increase rest and fluids.

## 2012-11-08 ENCOUNTER — Ambulatory Visit (HOSPITAL_BASED_OUTPATIENT_CLINIC_OR_DEPARTMENT_OTHER): Payer: BC Managed Care – PPO

## 2012-11-11 ENCOUNTER — Other Ambulatory Visit (HOSPITAL_BASED_OUTPATIENT_CLINIC_OR_DEPARTMENT_OTHER): Payer: BC Managed Care – PPO

## 2012-11-17 ENCOUNTER — Ambulatory Visit: Payer: BC Managed Care – PPO | Admitting: Obstetrics & Gynecology

## 2012-11-17 DIAGNOSIS — Z01419 Encounter for gynecological examination (general) (routine) without abnormal findings: Secondary | ICD-10-CM

## 2012-11-30 ENCOUNTER — Telehealth: Payer: Self-pay

## 2012-11-30 NOTE — Telephone Encounter (Signed)
Need to know what her symptoms are, itching? Discharge? Pain? Abdominal pain? After that can advise regarding diflucan

## 2012-11-30 NOTE — Telephone Encounter (Signed)
Pt called requesting an RX of [facility]? Pt didn't state any symptoms?  Please advise?

## 2012-12-01 MED ORDER — FLUCONAZOLE 150 MG PO TABS: 150.0000 mg | ORAL_TABLET | ORAL | Status: DC

## 2012-12-01 NOTE — Telephone Encounter (Signed)
Patient returned call to report that she discussed w/PCP that she is allergic to Polyurethane and/or Latex in Condoms and that they give her a yeast infection. Pt reports that her symptoms are: "milky, cottage cheese discharge & itching, states she does NOT have any pain and/or burning w/urination but that Monistat does not work for her"/SLS Please Advise.

## 2012-12-01 NOTE — Telephone Encounter (Signed)
LMOM with contact name and number RE: Rx to pharmacy and further provider instructions/SLS

## 2012-12-01 NOTE — Telephone Encounter (Signed)
OK to rx Diflucan 150 mg po q week x 2 weeks disp #2 with 1 rf, if no improvement then needs to come in for evaluation. Make sure she takes a probiotic

## 2012-12-01 NOTE — Telephone Encounter (Signed)
Left detailed message on cell# to call with current symptoms.

## 2012-12-02 ENCOUNTER — Ambulatory Visit: Payer: BC Managed Care – PPO | Admitting: Family Medicine

## 2012-12-13 ENCOUNTER — Other Ambulatory Visit (HOSPITAL_COMMUNITY)
Admission: RE | Admit: 2012-12-13 | Discharge: 2012-12-13 | Disposition: A | Discharge status: Home / Self Care | Payer: BC Managed Care – PPO | Source: Ambulatory Visit | Attending: Family | Admitting: Family

## 2012-12-13 ENCOUNTER — Ambulatory Visit: Payer: BC Managed Care – PPO | Admitting: Family Medicine

## 2012-12-13 ENCOUNTER — Ambulatory Visit (INDEPENDENT_AMBULATORY_CARE_PROVIDER_SITE_OTHER): Payer: BC Managed Care – PPO | Admitting: Family

## 2012-12-13 ENCOUNTER — Encounter: Payer: Self-pay | Admitting: Family

## 2012-12-13 VITALS — BP 112/82 | HR 80 | Temp 98.6°F | Resp 16 | Ht 67.5 in | Wt 261.1 lb

## 2012-12-13 DIAGNOSIS — Z01419 Encounter for gynecological examination (general) (routine) without abnormal findings: Secondary | ICD-10-CM | POA: Insufficient documentation

## 2012-12-13 DIAGNOSIS — N760 Acute vaginitis: Secondary | ICD-10-CM | POA: Insufficient documentation

## 2012-12-13 DIAGNOSIS — Z1151 Encounter for screening for human papillomavirus (HPV): Secondary | ICD-10-CM | POA: Insufficient documentation

## 2012-12-13 DIAGNOSIS — Z113 Encounter for screening for infections with a predominantly sexual mode of transmission: Secondary | ICD-10-CM | POA: Insufficient documentation

## 2012-12-13 NOTE — Progress Notes (Signed)
Subjective:    Patient ID: Toni Weiss, female    DOB: [birth year], 32 y.o.   MRN: [id number]  HPI  Toni Weiss is a 32 yr old female who presents today with chief complaint of vaginal itching. Symptoms started approximately 2 weeks ago. She reports that she took OTC monistat which was followed by diflucan without improvement in her symptoms. Reports that she recently has a new sexual partner. Has had unprotected sex.  Notes associated white discharge and itching. Denies associated odor.  Started out like "cottage cheese" like a regular yeast infection.  Denies fever, pelvic pain, dyspareunia.   She did not attend her GYN appointment due to cost.  Last pap smear showed insignificant cells. She wishes to repeat the pap here.  Review of Systems See HPI  Past Medical History  Diagnosis Date  . Chicken pox as a child  . Seizures     ended by age 41  . Depression   . Obesity   . Anemia     in pregnancy  . Depression with anxiety 08/04/2012  . Tobacco abuse 08/04/2012  . Ganglion cyst of wrist 08/04/2012    left  . Preventative health care 08/04/2012  . Cervical cancer screening 11/03/2012  . Acute bronchitis 11/06/2012    History   Social History  . Marital Status: Single    Spouse Name: N/A    Number of Children: N/A  . Years of Education: N/A   Occupational History  . Not on file.   Social History Main Topics  . Smoking status: Current Every Day Smoker -- 0.25 packs/day for 10 years    Types: Cigarettes    Start date: 10/30/2012  . Smokeless tobacco: Never Used  . Alcohol Use: Yes     Comment: occasionally  . Drug Use: No  . Sexual Activity: Yes    Partners: Male   Other Topics Concern  . Not on file   Social History Narrative  . No narrative on file    No past surgical history on file.  Family History  Problem Relation Age of Onset  . Hypertension Mother   . Other Mother 57    pacemaker  . Depression Mother   . Thyroid disease Mother     hypo  . Heart  disease Mother 34    bradycardia to low of 32, now with pacer, valvular heart dz  . Hyperlipidemia Father   . Hypertension Father   . Other Father     blood clots in lung  . Depression Father   . Depression Brother   . Other Brother     slow leaky valve  . Heart disease Brother     valvular heart disease  . Asthma Son   . Alcohol abuse Maternal Grandmother   . Heart disease Maternal Grandmother   . Stroke Maternal Grandmother   . Thyroid disease Maternal Grandmother     hypo  . Cancer Maternal Grandfather     prostate  . Alzheimer's disease Paternal Grandmother   . Cancer Paternal Grandfather     prostate  . Heart attack Paternal Grandfather   . Heart disease Paternal Grandfather 79    MI  . Thyroid disease Maternal Aunt     hyper  . Heart disease Maternal Aunt     Allergies  Allergen Reactions  . Latex     Current Outpatient Prescriptions on File Prior to Visit  Medication Sig Dispense Refill  . ALPRAZolam (XANAX) 0.25 MG tablet Take 1 tablet (  0.25 mg total) by mouth 2 (two) times daily as needed for sleep.  20 tablet  1  . escitalopram (LEXAPRO) 10 MG tablet Take 1 tablet (10 mg total) by mouth daily.  30 tablet  3  . Multiple Vitamins-Minerals (HM MULTIVITAMIN ADULT GUMMY PO) Take by mouth daily.      Marland Kitchen topiramate (TOPAMAX) 25 MG tablet Take 0.5 tablets (12.5 mg total) by mouth 2 (two) times daily.  30 tablet  1  . phentermine 15 MG capsule Take 1 capsule (15 mg total) by mouth every morning.  30 capsule  0   No current facility-administered medications on file prior to visit.    BP 112/82  Pulse 80  Temp(Src) 98.6 F (37 C) (Oral)  Resp 16  Ht 5' 7.5" (1.715 m)  Wt 261 lb 1.9 oz (118.443 kg)  BMI 40.27 kg/m2  SpO2 99%  LMP 11/13/2012       Objective:   Physical Exam  Constitutional: She is oriented to person, place, and time. She appears well-developed and well-nourished. No distress.  HENT:  Head: Normocephalic and atraumatic.  Cardiovascular:  Regular rhythm.   No murmur heard. Pulmonary/Chest: Effort normal.  Genitourinary:  Inguinal/mons: Normal without inguinal adenopathy  External genitalia: Normal  BUS/Urethra/Skene's glands: Normal  Bladder: Normal  Vagina: Normal- some thin white discharge is noted Cervix: os is noted to be large, otherwise normal Anus and perineum: Normal    Musculoskeletal: She exhibits no edema.  Neurological: She is alert and oriented to person, place, and time.  Psychiatric: She has a normal mood and affect. Her behavior is normal. Judgment and thought content normal.          Assessment & Plan:

## 2012-12-13 NOTE — Patient Instructions (Signed)
We will contact you with your lab results. 

## 2012-12-13 NOTE — Assessment & Plan Note (Signed)
Pap was repeated today.  Will include Gc/Chlamydia, yeast, gardernella, trich.

## 2012-12-20 ENCOUNTER — Telehealth: Payer: Self-pay | Admitting: *Deleted

## 2012-12-20 NOTE — Telephone Encounter (Signed)
If no itching, likely normal for her.  Will likely vary through her menstrual cycle.  If symptoms worsen, let me know and I will refer to GYN.

## 2012-12-20 NOTE — Telephone Encounter (Signed)
Notified pt of normal results.  Pt states she continues to have a white vaginal discharge, no itching. Wants to know if this is just going to be normal for her?  Please advise.

## 2012-12-20 NOTE — Telephone Encounter (Signed)
Left detailed message on cell# and to call if further concerns.

## 2012-12-20 NOTE — Telephone Encounter (Signed)
Message copied by Kathi Simpers on Mon Dec 20, 2012  7:40 AM ------      Message from: O'SULLIVAN, MELISSA      Created: Thu Dec 16, 2012  9:06 AM       Pap is normal. Testing neg for yeast, bacteria, gonorrhea, chlamydia, trich, bacteria. ------

## 2012-12-29 ENCOUNTER — Other Ambulatory Visit: Payer: Self-pay | Admitting: Family Medicine

## 2013-01-03 ENCOUNTER — Ambulatory Visit: Payer: BC Managed Care – PPO | Admitting: Family Medicine

## 2013-01-18 ENCOUNTER — Other Ambulatory Visit: Payer: Self-pay | Admitting: Family Medicine

## 2013-01-18 DIAGNOSIS — F329 Major depressive disorder, single episode, unspecified: Secondary | ICD-10-CM

## 2013-01-18 DIAGNOSIS — F418 Other specified anxiety disorders: Secondary | ICD-10-CM

## 2013-01-18 MED ORDER — ALPRAZOLAM 0.25 MG PO TABS: 0.2500 mg | ORAL_TABLET | Freq: Two times a day (BID) | ORAL | Status: DC | PRN

## 2013-01-18 NOTE — Telephone Encounter (Signed)
RX faxed

## 2013-01-18 NOTE — Telephone Encounter (Signed)
Please advise refill?  Last RX was done on 11-03-12 quantity 20 with 1 refill  If ok fax to [phone]

## 2013-01-18 NOTE — Telephone Encounter (Signed)
Request refill on [place] [phone] [facility] [address]

## 2013-04-12 ENCOUNTER — Other Ambulatory Visit: Payer: Self-pay | Admitting: Family Medicine

## 2013-04-13 NOTE — Telephone Encounter (Signed)
RX faxed

## 2013-04-13 NOTE — Telephone Encounter (Signed)
Refill request for Alprazolam Last filled by MD on - 01/18/13 #20 x1 Last Appt: 12/13/2012 Next Appt: none Please advise refill?

## 2013-04-27 ENCOUNTER — Telehealth: Payer: Self-pay | Admitting: Family Medicine

## 2013-04-27 DIAGNOSIS — Z0000 Encounter for general adult medical examination without abnormal findings: Secondary | ICD-10-CM

## 2013-04-27 NOTE — Telephone Encounter (Signed)
Patient states that she has another yeast infection and would like to know if Dr. Abner GreenspanBlyth could call her in a cream and an antibiotic for this.

## 2013-04-28 ENCOUNTER — Other Ambulatory Visit: Payer: Self-pay | Admitting: Family Medicine

## 2013-04-28 ENCOUNTER — Telehealth: Payer: Self-pay

## 2013-04-28 DIAGNOSIS — B379 Candidiasis, unspecified: Secondary | ICD-10-CM

## 2013-04-28 MED ORDER — MICONAZOLE NITRATE 2 % VA CREA: 1.0000 | TOPICAL_CREAM | Freq: Every day | VAGINAL | Status: AC

## 2013-04-28 MED ORDER — FLUCONAZOLE 150 MG PO TABS: 150.0000 mg | ORAL_TABLET | ORAL | Status: DC

## 2013-04-28 NOTE — Telephone Encounter (Signed)
Pharmacy left a message stating that pt would like some cream for her itching and diflucan sent to pharmacy.   Look at previous note

## 2013-04-28 NOTE — Telephone Encounter (Signed)
Please advise 

## 2013-06-21 ENCOUNTER — Other Ambulatory Visit: Payer: Self-pay | Admitting: Family Medicine

## 2013-06-21 NOTE — Telephone Encounter (Signed)
Informed patient of medication refill and and she scheduled appointment for 06/23/13

## 2013-06-21 NOTE — Telephone Encounter (Signed)
Please advise refill? Last RX was 04-13-13 quantity 20 with 1 refill. The message on refill says please increase to .5 due to anxiety?

## 2013-06-21 NOTE — Telephone Encounter (Signed)
Ok do you want the 0.25 or .5

## 2013-06-21 NOTE — Telephone Encounter (Signed)
Needs to come in can have 5 tabs for now

## 2013-06-21 NOTE — Telephone Encounter (Signed)
Please call and inform pt about below note.

## 2013-06-21 NOTE — Telephone Encounter (Signed)
OK to use the 0.5

## 2013-06-23 ENCOUNTER — Ambulatory Visit: Payer: BC Managed Care – PPO | Admitting: Family Medicine

## 2013-06-23 DIAGNOSIS — Z0289 Encounter for other administrative examinations: Secondary | ICD-10-CM

## 2013-07-22 ENCOUNTER — Other Ambulatory Visit: Payer: Self-pay | Admitting: Family Medicine

## 2013-07-25 ENCOUNTER — Other Ambulatory Visit: Payer: Self-pay

## 2013-07-25 DIAGNOSIS — B379 Candidiasis, unspecified: Secondary | ICD-10-CM

## 2013-07-25 MED ORDER — FLUCONAZOLE 150 MG PO TABS: 150.0000 mg | ORAL_TABLET | ORAL | Status: AC

## 2013-07-25 NOTE — Telephone Encounter (Signed)
rx faxed

## 2013-07-25 NOTE — Telephone Encounter (Signed)
Please advise RX? Last RX was done on 06-17-13 quantity 5 with 1 refill  If ok fax to [phone]

## 2013-07-25 NOTE — Telephone Encounter (Signed)
Pt left a message stating that she needs diflucan refilled due to being allergic to latex and using condoms gives her a yeast infection?  Please advise refill?  Pt did NO SHOW 06-23-13

## 2013-08-20 ENCOUNTER — Other Ambulatory Visit: Payer: Self-pay | Admitting: Family Medicine

## 2013-08-22 NOTE — Telephone Encounter (Signed)
Please call and schedule an appt. Inform pt that there will not be anymore refills until she is seen. Thanks

## 2013-08-22 NOTE — Telephone Encounter (Signed)
Appointment scheduled with Toni Weiss

## 2013-09-14 ENCOUNTER — Ambulatory Visit: Payer: BC Managed Care – PPO | Admitting: Physician Assistant

## 2013-09-14 DIAGNOSIS — Z0289 Encounter for other administrative examinations: Secondary | ICD-10-CM

## 2013-09-21 ENCOUNTER — Other Ambulatory Visit: Payer: Self-pay | Admitting: Family Medicine

## 2013-10-13 ENCOUNTER — Ambulatory Visit: Payer: BC Managed Care – PPO | Admitting: Family Medicine

## 2013-10-13 DIAGNOSIS — Z0289 Encounter for other administrative examinations: Secondary | ICD-10-CM

## 2013-10-26 ENCOUNTER — Telehealth: Payer: Self-pay | Admitting: Family Medicine

## 2013-10-26 ENCOUNTER — Other Ambulatory Visit: Payer: Self-pay | Admitting: Family Medicine

## 2013-10-26 ENCOUNTER — Ambulatory Visit: Payer: Self-pay | Admitting: Physician Assistant

## 2013-10-26 DIAGNOSIS — Z0289 Encounter for other administrative examinations: Secondary | ICD-10-CM

## 2013-10-26 NOTE — Telephone Encounter (Signed)
RX denied - needs appt

## 2013-10-26 NOTE — Telephone Encounter (Signed)
Pt did not show up for apt scheduled for 10/26/13 @ 18:15.  This is the 4th no show in a row. Tried to contact, but no one answered.

## 2013-10-26 NOTE — Telephone Encounter (Signed)
We should dismiss her. Did you have a procedure to do that at [facility] or should I start that process

## 2013-10-27 ENCOUNTER — Encounter: Payer: Self-pay | Admitting: Family Medicine

## 2013-10-27 NOTE — Telephone Encounter (Signed)
Put in fax pile with attn [place]

## 2013-10-27 NOTE — Telephone Encounter (Signed)
We didn't have specific procedure on our end, so you and Dr. Abner Greenspan can start the process.

## 2013-10-27 NOTE — Telephone Encounter (Signed)
I have signed letter, please forward for dismissal

## 2013-12-04 ENCOUNTER — Other Ambulatory Visit: Payer: Self-pay | Admitting: Family Medicine

## 2017-04-14 ENCOUNTER — Other Ambulatory Visit: Payer: Self-pay

## 2017-04-14 ENCOUNTER — Emergency Department (INDEPENDENT_AMBULATORY_CARE_PROVIDER_SITE_OTHER)
Admission: EM | Admit: 2017-04-14 | Discharge: 2017-04-14 | Disposition: A | Discharge status: Home / Self Care | Payer: BLUE CROSS/BLUE SHIELD | Source: Home / Self Care | Attending: Family Medicine | Admitting: Family Medicine

## 2017-04-14 ENCOUNTER — Emergency Department (INDEPENDENT_AMBULATORY_CARE_PROVIDER_SITE_OTHER): Payer: BLUE CROSS/BLUE SHIELD

## 2017-04-14 DIAGNOSIS — M5441 Lumbago with sciatica, right side: Secondary | ICD-10-CM

## 2017-04-14 DIAGNOSIS — M5136 Other intervertebral disc degeneration, lumbar region: Secondary | ICD-10-CM

## 2017-04-14 MED ORDER — KETOROLAC TROMETHAMINE 60 MG/2ML IM SOLN
60.0000 mg | Freq: Once | INTRAMUSCULAR | Status: AC
  Administered 2017-04-14: 60 mg via INTRAMUSCULAR

## 2017-04-14 MED ORDER — METHOCARBAMOL 750 MG PO TABS: 750.0000 mg | ORAL_TABLET | Freq: Four times a day (QID) | ORAL | 1 refills | Status: AC

## 2017-04-14 MED ORDER — PREDNISONE 20 MG PO TABS: ORAL_TABLET | ORAL | 0 refills | Status: AC
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
## 2017-04-14 NOTE — Discharge Instructions (Signed)
Apply ice pack for 20 to 30 minutes, 3 to 4 times daily  Continue until pain and swelling decrease.  Begin back stretching and range of motion exercises as tolerated.
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
## 2018-10-28 ENCOUNTER — Ambulatory Visit (HOSPITAL_COMMUNITY): Payer: Medicaid Other | Admitting: Psychiatry
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
